# Patient Record
Sex: Male | Born: 1985 | Race: Black or African American | Hispanic: No | Marital: Single
Health system: Southern US, Community
[De-identification: ages and names within clinical notes are randomized; demographics above are authoritative.]

---

## 2011-09-08 ENCOUNTER — Emergency Department: Payer: Self-pay | Admitting: Emergency Medicine

## 2011-09-08 LAB — COMPREHENSIVE METABOLIC PANEL
Albumin: 4 g/dL (ref 3.4–5.0)
Alkaline Phosphatase: 92 U/L (ref 50–136)
Anion Gap: 7 (ref 7–16)
BUN: 13 mg/dL (ref 7–18)
Bilirubin,Total: 0.4 mg/dL (ref 0.2–1.0)
Calcium, Total: 9 mg/dL (ref 8.5–10.1)
Co2: 28 mmol/L (ref 21–32)
EGFR (African American): >60
EGFR (Non-African Amer.): >60
Glucose: 83 mg/dL (ref 65–99)
Potassium: 4.2 mmol/L (ref 3.5–5.1)
SGOT(AST): 48 U/L — ABNORMAL HIGH (ref 15–37)
Sodium: 139 mmol/L (ref 136–145)
Total Protein: 8.3 g/dL — ABNORMAL HIGH (ref 6.4–8.2)

## 2011-09-08 LAB — CBC
HCT: 47.4 % (ref 40.0–52.0)
HGB: 15.9 g/dL (ref 13.0–18.0)
MCH: 31.8 pg (ref 26.0–34.0)
MCV: 95 fL (ref 80–100)
Platelet: 382 10*3/uL (ref 150–440)
RDW: 13.4 % (ref 11.5–14.5)
WBC: 6.7 10*3/uL (ref 3.8–10.6)

## 2011-09-13 ENCOUNTER — Observation Stay: Payer: Self-pay | Admitting: Surgery

## 2011-09-13 LAB — COMPREHENSIVE METABOLIC PANEL
Albumin: 4.2 g/dL (ref 3.4–5.0)
BUN: 13 mg/dL (ref 7–18)
Bilirubin,Total: 0.7 mg/dL (ref 0.2–1.0)
Calcium, Total: 9.5 mg/dL (ref 8.5–10.1)
Chloride: 103 mmol/L (ref 98–107)
EGFR (African American): >60
EGFR (Non-African Amer.): >60
Glucose: 140 mg/dL — ABNORMAL HIGH (ref 65–99)
Osmolality: 276 (ref 275–301)
Potassium: 4.1 mmol/L (ref 3.5–5.1)
SGPT (ALT): 84 U/L — ABNORMAL HIGH
Sodium: 137 mmol/L (ref 136–145)
Total Protein: 9.3 g/dL — ABNORMAL HIGH (ref 6.4–8.2)

## 2011-09-13 LAB — URINALYSIS, COMPLETE
Bacteria: NONE SEEN
Bilirubin,UR: NEGATIVE
Hyaline Cast: 1
Ketone: NEGATIVE
Leukocyte Esterase: NEGATIVE
Protein: 30
Specific Gravity: 1.029 (ref 1.003–1.030)
Squamous Epithelial: NONE SEEN

## 2011-09-13 LAB — CBC WITH DIFFERENTIAL/PLATELET
Basophil %: 0.4 %
Eosinophil #: 0.1 10*3/uL (ref 0.0–0.7)
Eosinophil %: 1.2 %
HCT: 45.7 % (ref 40.0–52.0)
HGB: 15.5 g/dL (ref 13.0–18.0)
Lymphocyte #: 1.8 10*3/uL (ref 1.0–3.6)
Lymphocyte %: 17.7 %
MCH: 31.8 pg (ref 26.0–34.0)
MCHC: 33.9 g/dL (ref 32.0–36.0)
Monocyte #: 0.7 x10 3/mm (ref 0.2–1.0)
Monocyte %: 6.8 %
Neutrophil %: 73.9 %
Platelet: 393 10*3/uL (ref 150–440)
RDW: 13.2 % (ref 11.5–14.5)
WBC: 10.3 10*3/uL (ref 3.8–10.6)

## 2011-09-13 LAB — PROTIME-INR
INR: 0.8
Prothrombin Time: 11.5 secs (ref 11.5–14.7)

## 2011-09-13 LAB — LIPASE, BLOOD: Lipase: 193 U/L (ref 73–393)

## 2011-09-13 LAB — APTT: Activated PTT: <23 secs — ABNORMAL LOW (ref 23.6–35.9)

## 2011-09-14 LAB — CBC WITH DIFFERENTIAL/PLATELET
Basophil #: 0 10*3/uL (ref 0.0–0.1)
HCT: 42.7 % (ref 40.0–52.0)
HGB: 14.1 g/dL (ref 13.0–18.0)
Lymphocyte #: 1.1 10*3/uL (ref 1.0–3.6)
MCH: 31.2 pg (ref 26.0–34.0)
MCHC: 33.1 g/dL (ref 32.0–36.0)
MCV: 94 fL (ref 80–100)
Monocyte #: 1.2 x10 3/mm — ABNORMAL HIGH (ref 0.2–1.0)
Monocyte %: 9.8 %
Neutrophil #: 10 10*3/uL — ABNORMAL HIGH (ref 1.4–6.5)
Platelet: 344 10*3/uL (ref 150–440)
RDW: 13.1 % (ref 11.5–14.5)
WBC: 12.4 10*3/uL — ABNORMAL HIGH (ref 3.8–10.6)

## 2011-09-21 ENCOUNTER — Ambulatory Visit: Payer: Self-pay | Admitting: Surgery

## 2011-09-23 ENCOUNTER — Inpatient Hospital Stay: Payer: Self-pay | Admitting: Surgery

## 2011-09-23 LAB — COMPREHENSIVE METABOLIC PANEL
Anion Gap: 6 — ABNORMAL LOW (ref 7–16)
BUN: 11 mg/dL (ref 7–18)
Bilirubin,Total: 0.4 mg/dL (ref 0.2–1.0)
Calcium, Total: 9.1 mg/dL (ref 8.5–10.1)
Chloride: 105 mmol/L (ref 98–107)
Creatinine: 1.01 mg/dL (ref 0.60–1.30)
EGFR (African American): >60
EGFR (Non-African Amer.): >60
Potassium: 4.1 mmol/L (ref 3.5–5.1)
SGOT(AST): 52 U/L — ABNORMAL HIGH (ref 15–37)
SGPT (ALT): 152 U/L — ABNORMAL HIGH

## 2011-09-23 LAB — LIPASE, BLOOD: Lipase: 170 U/L (ref 73–393)

## 2011-09-24 LAB — CBC WITH DIFFERENTIAL/PLATELET
Basophil #: 0.1 10*3/uL (ref 0.0–0.1)
Basophil %: 0.8 %
HCT: 39.4 % — ABNORMAL LOW (ref 40.0–52.0)
HGB: 13.2 g/dL (ref 13.0–18.0)
Lymphocyte #: 2.7 10*3/uL (ref 1.0–3.6)
Lymphocyte %: 29.9 %
MCH: 31.7 pg (ref 26.0–34.0)
MCV: 94 fL (ref 80–100)
Monocyte %: 10.5 %
Neutrophil #: 4.8 10*3/uL (ref 1.4–6.5)
RDW: 12.8 % (ref 11.5–14.5)
WBC: 9 10*3/uL (ref 3.8–10.6)

## 2013-07-03 IMAGING — US ABDOMEN ULTRASOUND LIMITED
1 series · 14 of 25 positions shown · non-contrast
Comparison: none

REASON FOR EXAM: RUQ pain
COMMENTS:   Body Site: GB and Fossa, CBD, Head of Pancreas

PROCEDURE:     US  - US ABDOMEN LIMITED SURVEY  - September 13, 2011 [DATE]
RESULT:     Comparison: 09/08/2011
TECHNIQUE: Multiple grayscale and color Doppler images were obtained of the
right upper quadrant.

[Series 1: abdomen ultrasound limited · 0.31mm/px · 14 of 32 slices shown]
[im 1/32]
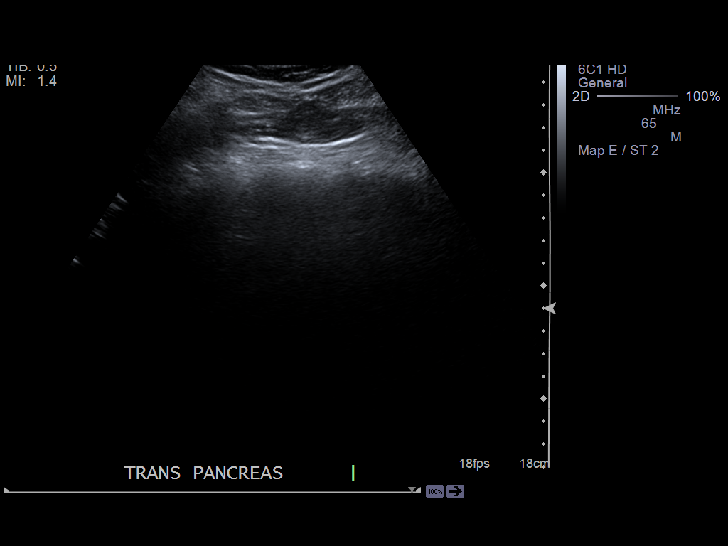
[im 3/32]
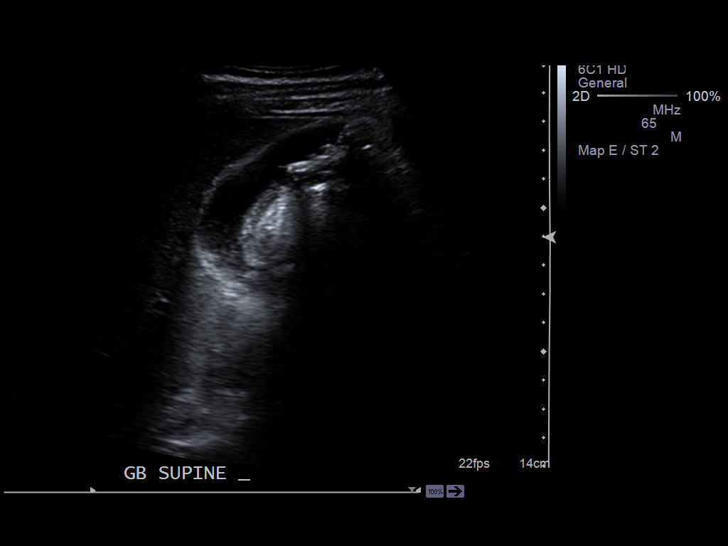
[im 6/32]
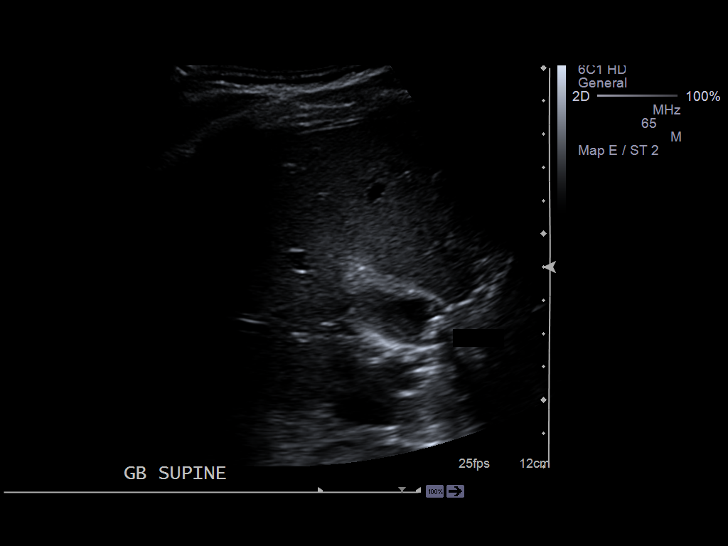
[im 8/32]
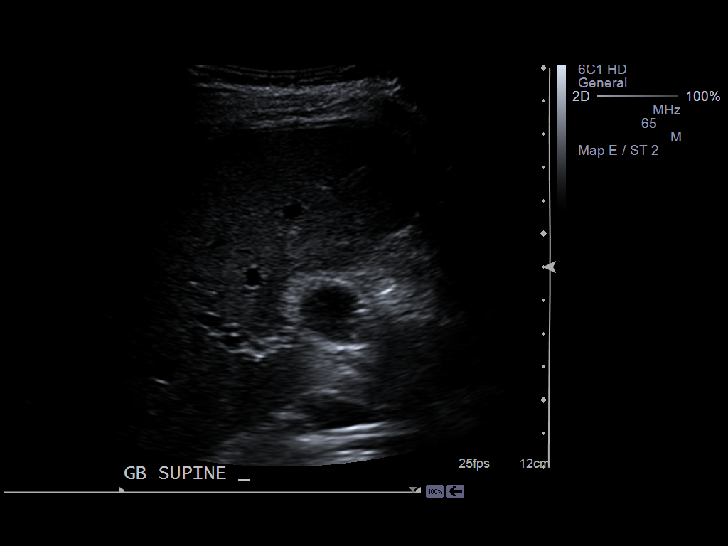
[im 11/32]
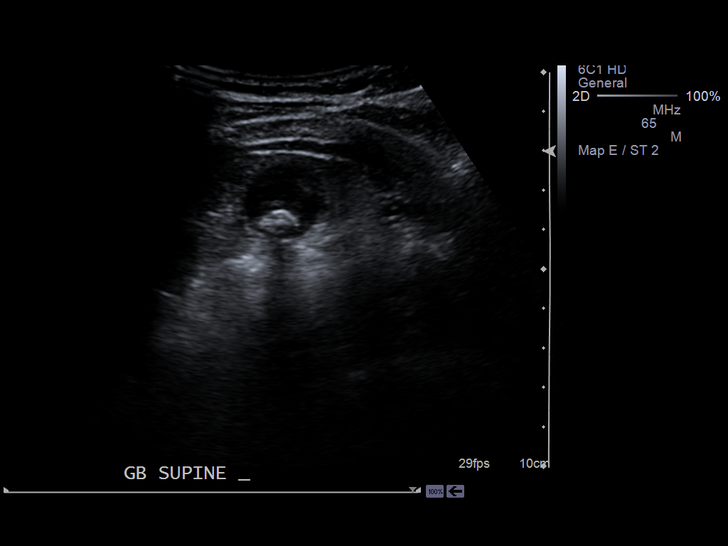
[im 12/32]
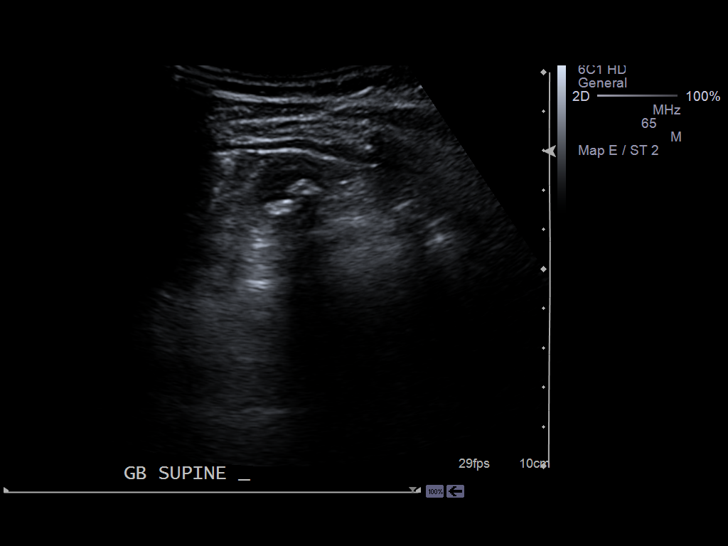
[im 15/32]
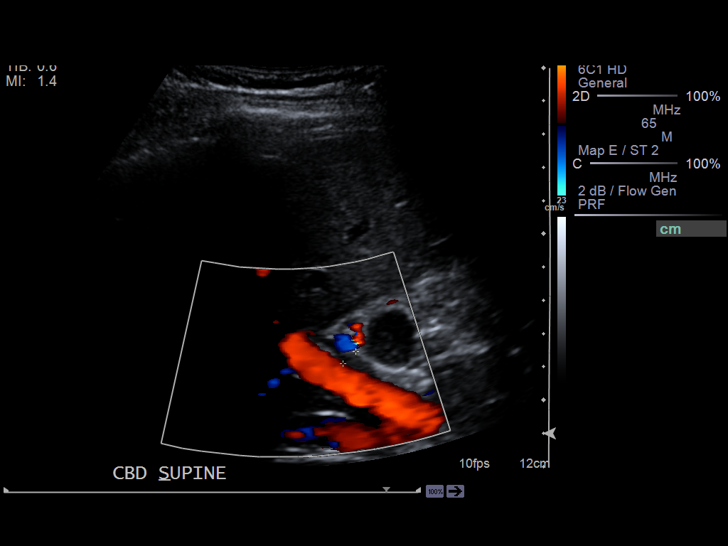
[im 17/32]
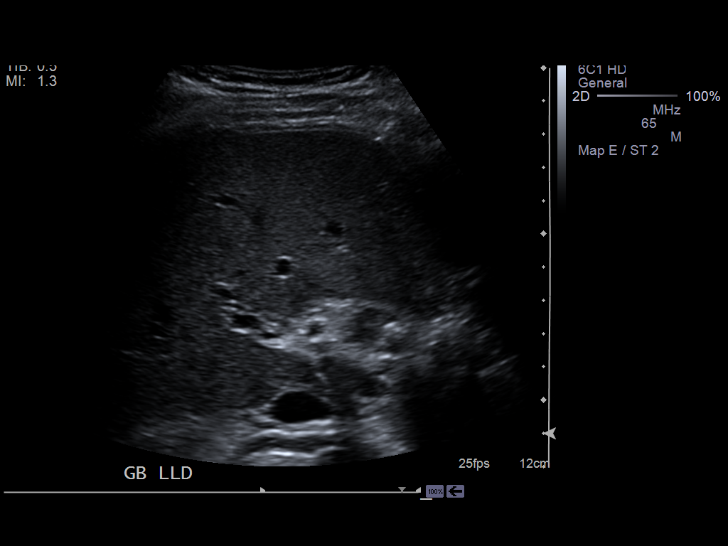
[im 20/32]
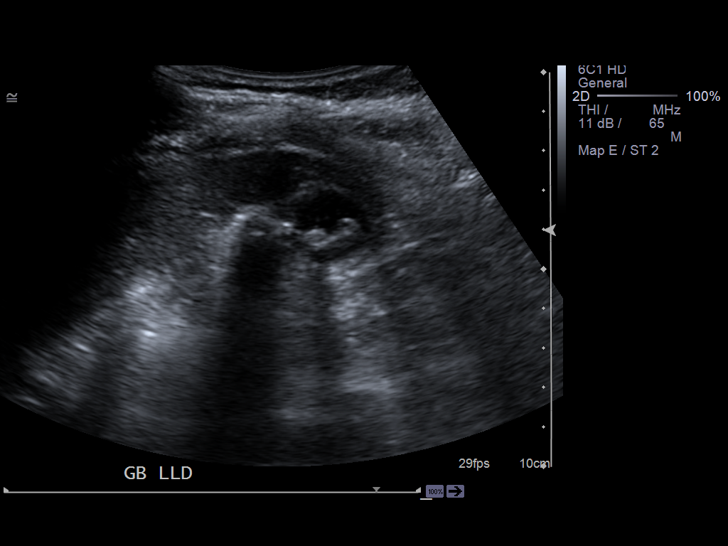
[im 21/32]
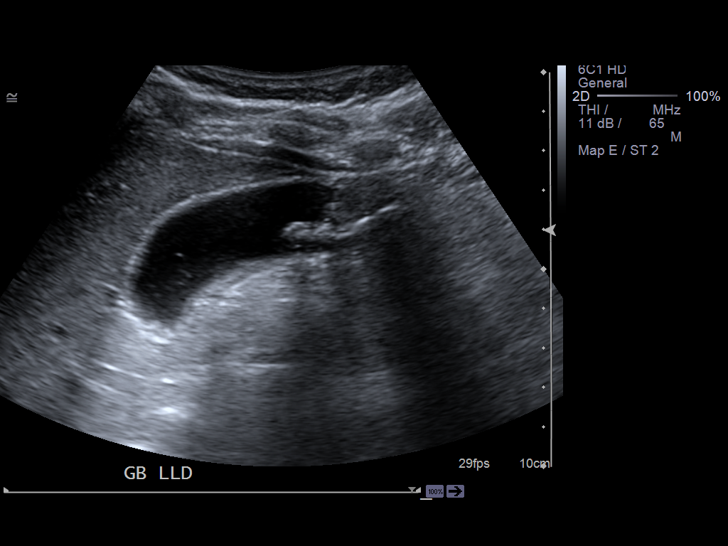
[im 24/32]
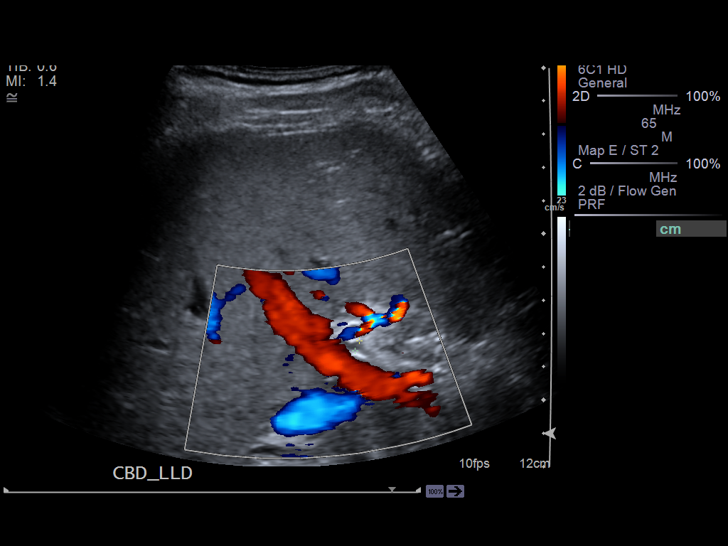
[im 26/32]
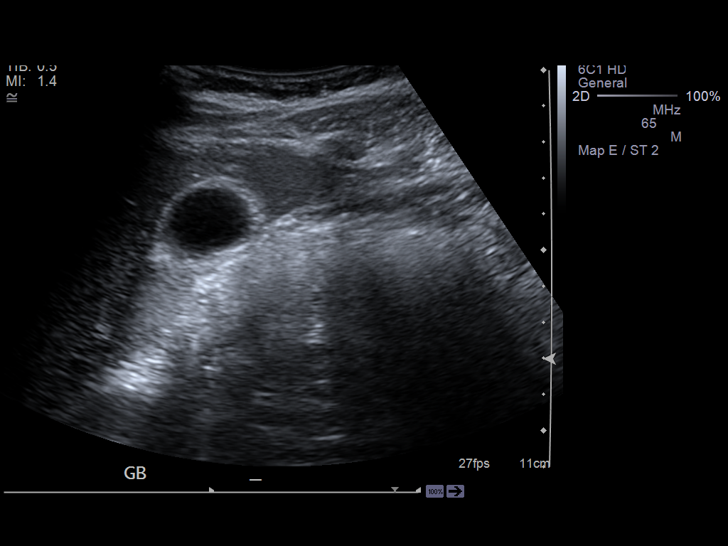
[im 29/32]
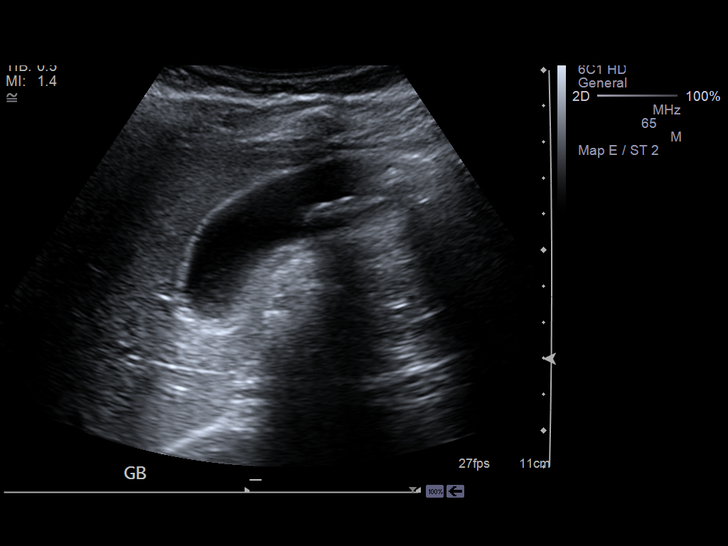
[im 32/32]
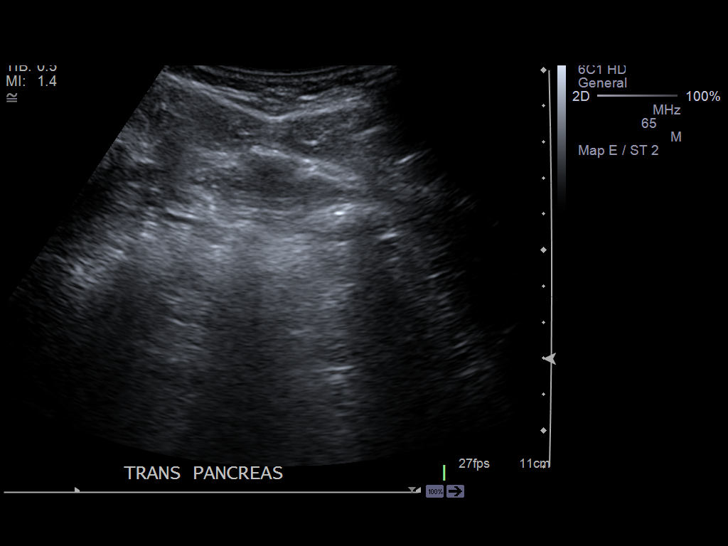

[14 of 25 positions shown; findings below may reference images not displayed]

FINDINGS: The gallbladder is obscured by overlying bowel gas. There are multiple
stones and sludge in the gallbladder. There is a nonmobile stone in the
gallbladder neck. The gallbladder wall thickness is at the upper limits of
normal, measuring 3 mm. The sonographic Murphy sign could not be accurately
assessed secondary to the patient's medicated state. The common bile duct
measures 5 mm in diameter.
IMPRESSION: Cholelithiasis, with nonmobile stone in the gallbladder neck. Gallbladder
wall thickness is at the upper limits of normal. Correlate clinically for
cholecystitis.

[REDACTED]

## 2013-07-11 IMAGING — NM NUCLEAR MEDICINE HEPATOHBILIARY INCLUDE GB
1 series · 9 of 9 positions shown · non-contrast
Comparison: none

REASON FOR EXAM: post op gallbladder bile in JP drain eval bile leak
COMMENTS:

[Series 1000: billiary leak · 4.80mm/px · 9 of 9 slices shown]
[im 1/9]
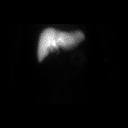
[im 2/9]
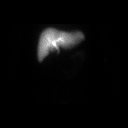
[im 3/9]
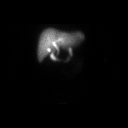
[im 4/9]
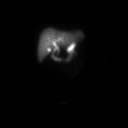
[im 5/9]
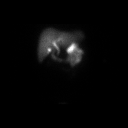
[im 6/9]
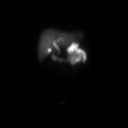
[im 7/9]
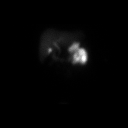
[im 8/9]
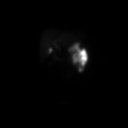
[im 9/9]
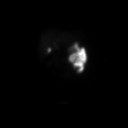

[9 of 9 positions shown; findings below may reference images not displayed]

PROCEDURE:     NM  - NM HEPATOBILIARY IMAGE  - September 21, 2011 [DATE]

RESULT:     The patient is undergoing evaluation for possible bile leak. The
patient is status post cholecystectomy and has a Jackson-Pratt drain place.
The patient received 8.3 mCi of technetium 99m labeled Choletec. Patient's
drainage tube was clamped prior to scanning.

There is adequate uptake of the radiopharmaceutical by the liver. Activity
is demonstrated within the intrahepatic ducts and common bile duct by 5
minutes and duodenal activity is demonstrated by 10 to 15 minutes. On the 10
minute film there is a focus of activity demonstrated in the porta hepatis
that persists and becomes gradually larger that is worrisome for small
contained bile leak.
IMPRESSION: The findings are worrisome for a bile leak in the porta
hepatis to the right of the common bile duct. CT scanning is recommended to
assess this region more completely.

## 2013-07-13 IMAGING — US ABDOMEN ULTRASOUND
1 series · 14 of 25 positions shown · non-contrast
Comparison: none

REASON FOR EXAM: COMMENTS:

[Series 1: abdomen ultrasound · 0.25mm/px · 14 of 93 slices shown]
[im 1/93]
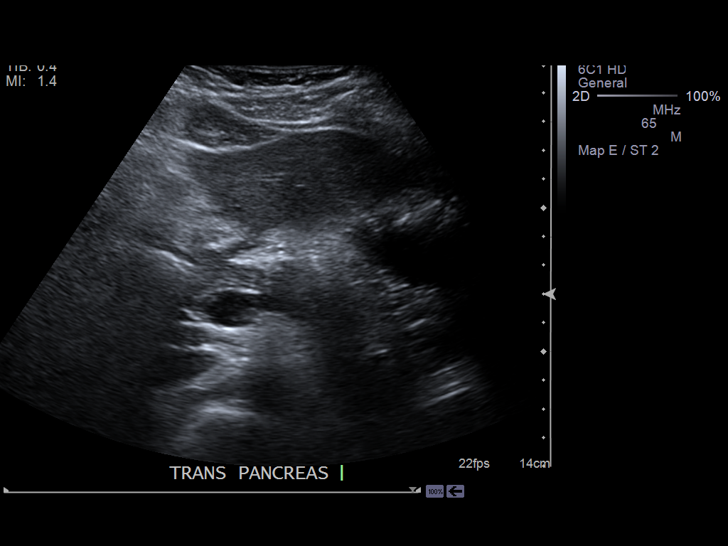
[im 8/93]
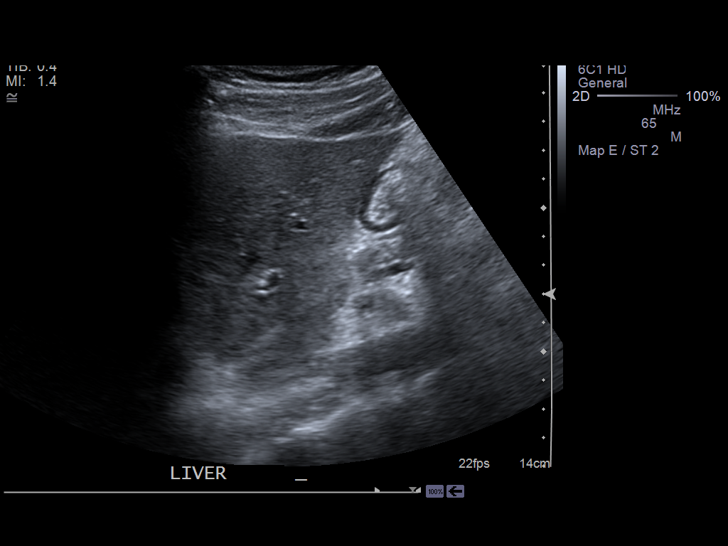
[im 16/93]
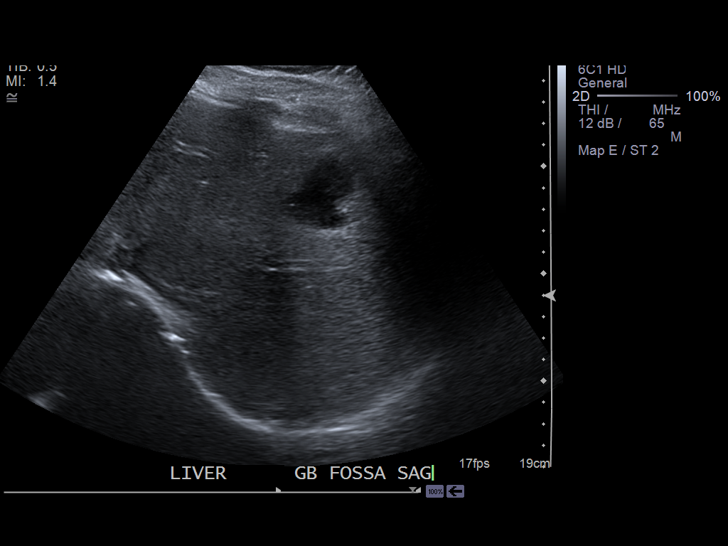
[im 24/93]
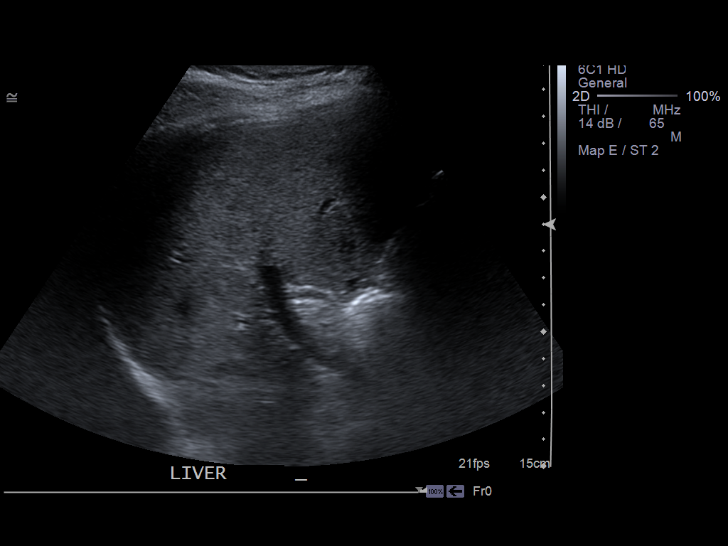
[im 31/93]
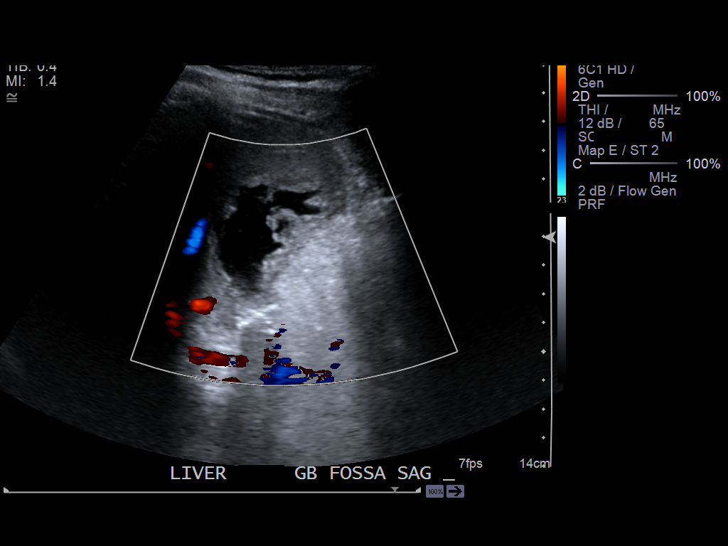
[im 35/93]
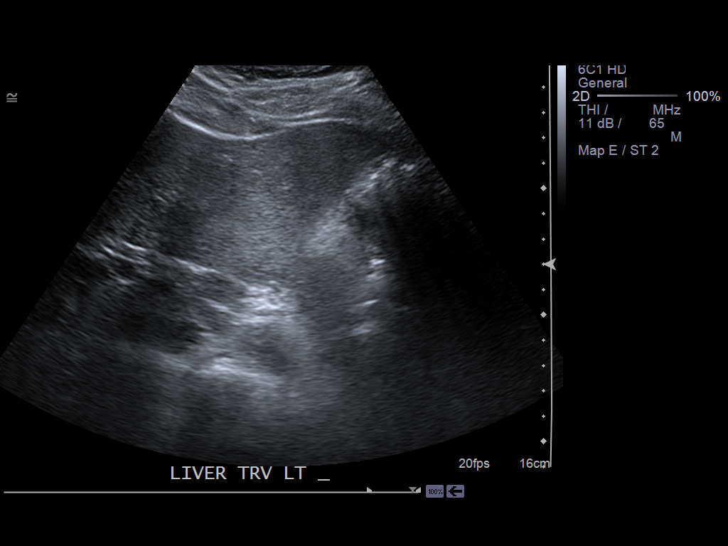
[im 43/93]
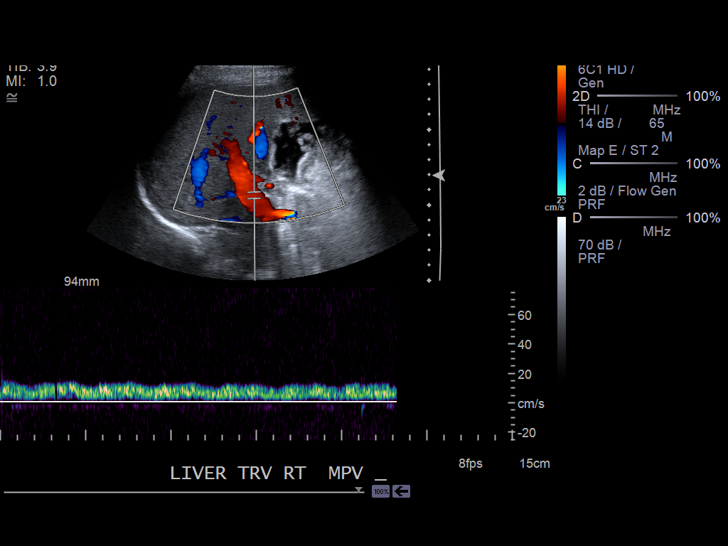
[im 50/93]
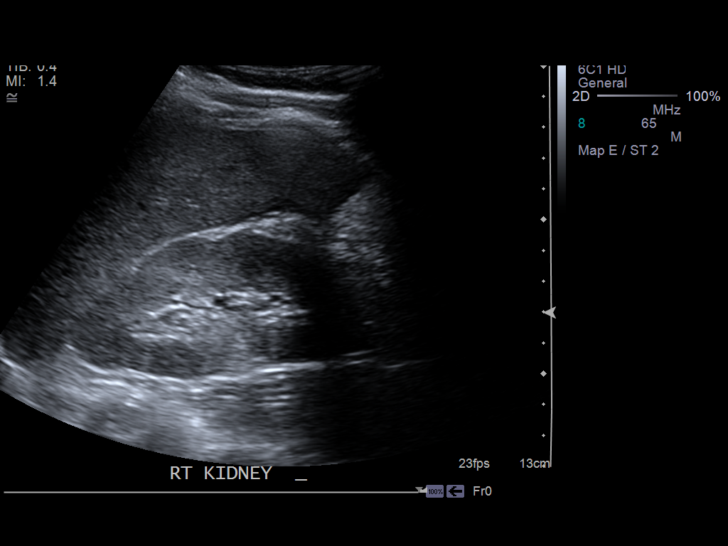
[im 58/93]
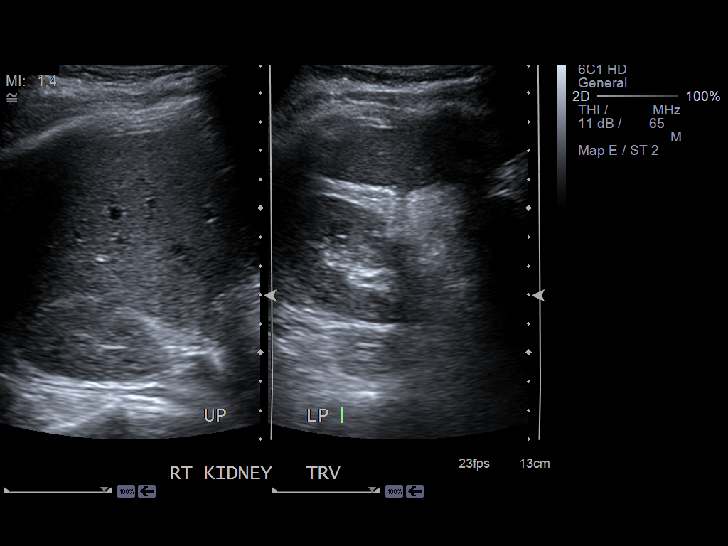
[im 62/93]
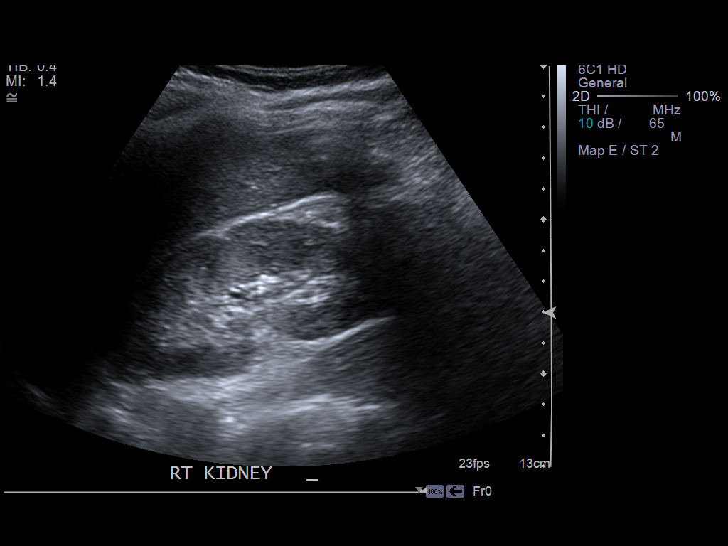
[im 70/93]
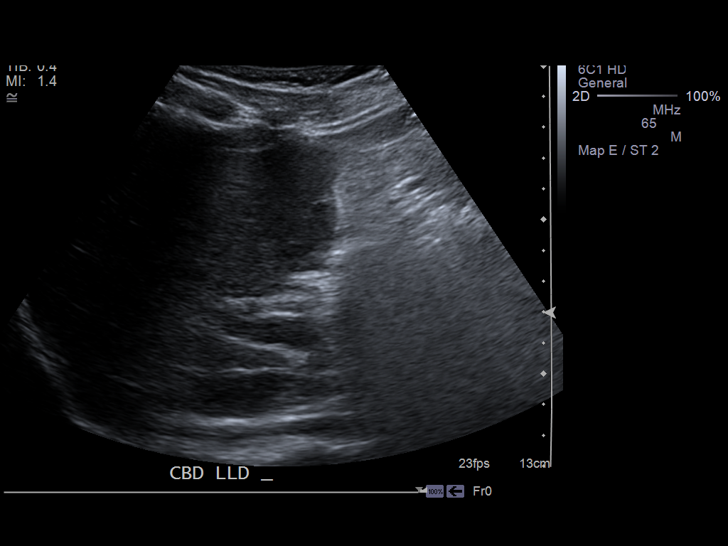
[im 77/93]
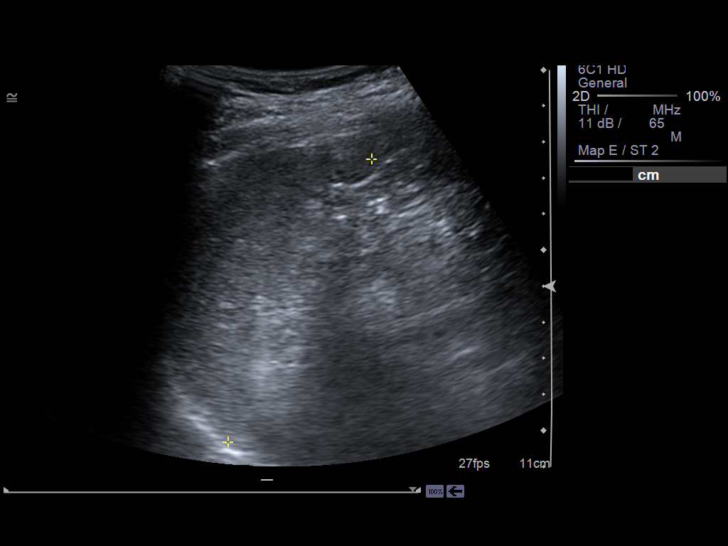
[im 85/93]
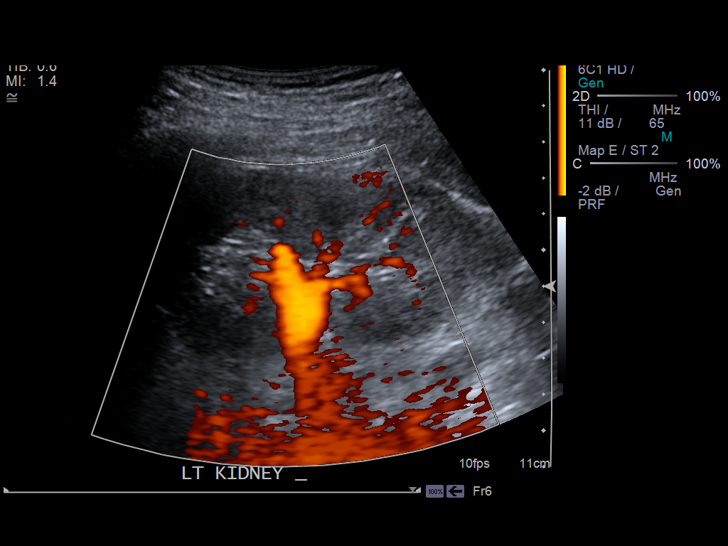
[im 93/93]
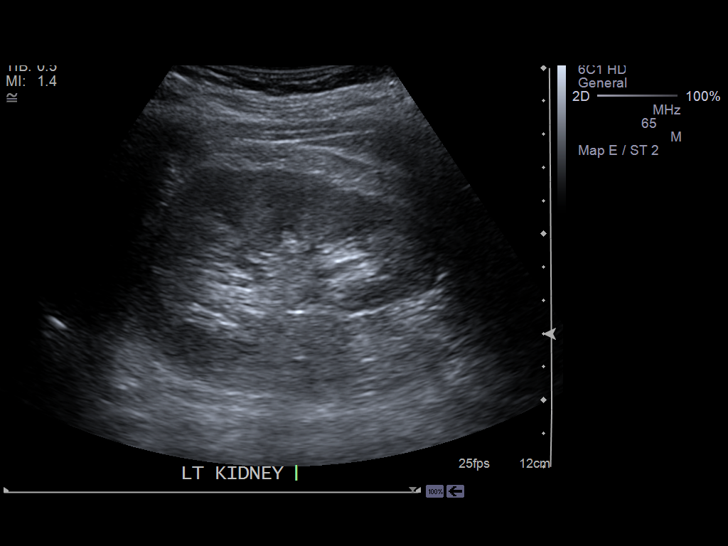

[14 of 25 positions shown; findings below may reference images not displayed]

PROCEDURE:     US  - US ABDOMEN GENERAL SURVEY  - September 23, 2011  [DATE]

RESULT:     [REDACTED]

Addendum:

The previous report was dictated based on images of 09/08/2011.

The CORRECT report is below based on images of 09/23/2011

There is a small complex fluid collection in the gallbladder fossa which may
resent a biloma versus hematoma versus seroma measuring approximately 4.5 x
2.3 cm. There is no internal Doppler flow.

The common bile duct is normal in caliber measuring 4.6 mm. There is no
choledocholithiasis. There is no perihepatic free fluid.

The liver, spleen, pancreas, right kidney and left kidney demonstrate no
focal abnormality. The abdominal aorta and IVC are unremarkable.
IMPRESSION: There is a small complex fluid collection in the gallbladder fossa which may
resent a biloma versus hematoma versus seroma measuring approximately 4.5 x
2.3 cm.

## 2014-07-03 NOTE — Discharge Summary (Signed)
PATIENT NAME:  Victor MaltaROBINSON, Stanford MR#:  [id number] DATE OF BIRTH:  [birth year]  DATE OF ADMISSION:  09/23/2011 DATE OF DISCHARGE:  09/25/2011  BRIEF HISTORY: Victor MaltaJason Meineke is a 29 year old gentleman who underwent laparoscopic cholecystectomy for symptomatic biliary tract disease on 09/13/2011. His admission was uncomplicated and he was sent home with a JP drain in place on 07/01. Did have significant inflammatory change at the time of surgery and was discharged with the plan of following up in the office in several days. He was seen in the office on 07/05 noted to have bile drainage in his JP drain. For that reason he was recommended to be hospitalized for further follow up of possible bile leak. He refused hospitalization at that time agreeing to return to the hospital on Monday for HIDA scan. The HIDA scan was performed on 07/08 which did demonstrate evidence of possible bile leak. We again recommend he be hospitalized for further investigation but he declined stating he did not come to the hospital until the 10th. [facility] was [facility]10 for further evaluation. Still had bile in his drain but had no significant elevation of his liver function studies, no elevation of his white blood cell count and no abdominal symptoms. He was seen by the GI service. Lutricia Feilaul Oh consulted on the case and agreed with the plan to pursue ERCP. The procedure was performed the following morning without difficulty. No evidence of bile leak was encountered. His JP drain was removed on the afternoon of the 11th and he was discharged home on the morning and 12th having tolerated a regular diet with no significant problems. He was ambulating without difficulty. He is discharged home on Norco 5/325, 1 to 2 tablets p.o. q.6 hours p.r.n.   FINAL DISCHARGE DIAGNOSIS: Postoperative bile leak.   SURGERY: ERCP.   ____________________________ Carmie Endalph L. Ely III, MD rle:cms D: 10/02/2011 20:45:45 ET T: 10/05/2011 10:03:55  ET JOB#: [id number]  cc: Carmie Endalph L. Ely III, MD, <Dictator> Ezzard StandingPaul Y. Bluford Kaufmannh, MD Quentin OreALPH L ELY MD ELECTRONICALLY SIGNED 10/06/2011 22:42

## 2014-07-08 NOTE — H&P (Signed)
Subjective/Chief Complaint severe abdominal pain    History of Present Illness 26 yobm with acute onset of severe epigastric and RUQ abdominal pain around 4 am.  Unrelenting, no fever, no jaundice, no sick contacts, previous episode of pain earlier this week which resolved following visit to ER.    Past History none    Primary Physician none   Past Med/Surgical Hx:  Denies medical history:   Denies surgical history.:   ALLERGIES:  No Known Allergies:   Family and Social History:   Family History gallstones in mother    Social History positive  tobacco, positive tobacco (Greater than 1 year), positive ETOH, negative Illicit drugs    Place of Living Home  employed.   Review of Systems:   Subjective/Chief Complaint see above    Abdominal Pain Yes    Nausea/Vomiting No    Tolerating Diet No    ROS ros as above.    Medications/Allergies Reviewed Medications/Allergies reviewed   Physical Exam:   GEN no acute distress, thin    HEENT red conjunctivae    NECK supple    RESP normal resp effort    CARD regular rate    ABD positive tenderness  denies Flank Tenderness  no liver/spleen enlargement  no hernia  distended    LYMPH negative neck    EXTR negative cyanosis/clubbing    SKIN normal to palpation    NEURO cranial nerves intact    PSYCH A+O to time, place, person   Lab Results: Hepatic:  30-Jun-13 07:56    Bilirubin, Total 0.7   Alkaline Phosphatase 98   SGPT (ALT)  84 (12-78 NOTE: NEW REFERENCE RANGE 02/06/2011)   SGOT (AST) 35   Total Protein, Serum  9.3   Albumin, Serum 4.2  Routine Chem:  30-Jun-13 07:56    Glucose, Serum  140   BUN 13   Creatinine (comp) 1.14   Sodium, Serum 137   Potassium, Serum 4.1   Chloride, Serum 103   CO2, Serum 26   Calcium (Total), Serum 9.5   Osmolality (calc) 276   eGFR (African American) >60   eGFR (Non-African American) >60 (eGFR values <51m/min/1.73 m2 may be an indication of chronic kidney disease  (CKD). Calculated eGFR is useful in patients with stable renal function. The eGFR calculation will not be reliable in acutely ill patients when serum creatinine is changing rapidly. It is not useful in  patients on dialysis. The eGFR calculation may not be applicable to patients at the low and high extremes of body sizes, pregnant women, and vegetarians.)   Anion Gap 8   Lipase 193 (Result(s) reported on 13 Sep 2011 at 09:07AM.)  Routine UA:  30-Jun-13 07:58    Color (UA) Yellow   Clarity (UA) Hazy   Glucose (UA) Negative   Bilirubin (UA) Negative   Ketones (UA) Negative   Specific Gravity (UA) 1.029   Blood (UA) Negative   pH (UA) 6.0   Protein (UA) 30 mg/dL   Nitrite (UA) Negative   Leukocyte Esterase (UA) Negative (Result(s) reported on 13 Sep 2011 at 09:07AM.)   RBC (UA) 3 /HPF   WBC (UA) 1 /HPF   Bacteria (UA) NONE SEEN   Epithelial Cells (UA) NONE SEEN   Mucous (UA) PRESENT   Hyaline Cast (UA) 1 /LPF (Result(s) reported on 13 Sep 2011 at 09:07AM.)  Routine Hem:  30-Jun-13 07:56    WBC (CBC) 10.3   RBC (CBC) 4.86   Hemoglobin (CBC) 15.5  Hematocrit (CBC) 45.7   Platelet Count (CBC) 393   MCV 94   MCH 31.8   MCHC 33.9   RDW 13.2   Neutrophil % 73.9   Lymphocyte % 17.7   Monocyte % 6.8   Eosinophil % 1.2   Basophil % 0.4   Neutrophil #  7.6   Lymphocyte # 1.8   Monocyte # 0.7   Eosinophil # 0.1   Basophil # 0.0 (Result(s) reported on 13 Sep 2011 at 09:16AM.)   Radiology Results: [place]:    25-Jun-13 06:48, US Abdomen Limited Survey   US Abdomen Limited Survey   REASON FOR EXAM:    epigastric and ruq pain  COMMENTS:   Body Site: GB and Fossa, CBD, Head of Pancreas; Right Upper   Quad    PROCEDURE: [place]  - US ABDOMEN LIMITED SURVEY  - Sep 08 2011  6:48AM     RESULT: Comparison: None.    Technique: Multiple grayscale and color Doppler images were obtained of   the right upper quadrant.    Findings:  The pancreas was mostly obscured by overlying bowel gas.  The visualized   portion of the body of the pancreas is unremarkable. There are multiple   stones in thegallbladder. No gallbladder wall thickening or     pericholecystic fluid. The sonographic Percell Miller sign was negative. The   common bile duct measures 4 mm in diameter. The visualized liver is   unremarkable. The portal vein is patent.    IMPRESSION:   Cholelithiasis, without sonographic evidence of acute cholecystitis.          Verified By: Gregor Hams, M.D., MD    30-Jun-13 11:53, US Abdomen Limited Survey   US Abdomen Limited Survey   REASON FOR EXAM:    RUQ pain  COMMENTS:   Body Site: GB and Fossa, CBD, Head of Pancreas    PROCEDURE: [place]  - US ABDOMEN LIMITED SURVEY  - Sep 13 2011 11:53AM     RESULT: Comparison: 09/08/2011    Technique: Multiple grayscale and color Doppler images were obtained of   the right upper quadrant.    Findings:  The gallbladder is obscured by overlying bowel gas. There are multiple   stones and sludge in the gallbladder. There is a nonmobile stone in the   gallbladder neck. The gallbladder wall thickness is at the upper limits   of normal, measuring 3 mm. The sonographic Percell Miller sign could not be     accurately assessed secondary to the patient's medicated state. The   common bile duct measures 5 mm in diameter.    IMPRESSION:   Cholelithiasis, with nonmobile stone in the gallbladder neck. Gallbladder   wall thickness is at the upper limits of normal. Correlate clinically for   cholecystitis.      Dictation Site: 8          Verified By: Gregor Hams, M.D., MD     Assessment/Admission Diagnosis 26 yobm with acute calculus cholecystitis    Plan admit, lap chole later today. invanz, scd's void on call to surgery discussed with patient and father r/b/a to cholecystectomy including bile duct injury (0.05% risk), open procedure and need for ercp post op.  total time spent 45 minutes  Both [place] personally reviewed.   Electronic  Signatures: Sherri Rad (MD)  (Signed 30-Jun-13 14:48)  Authored: CHIEF COMPLAINT and HISTORY, PAST MEDICAL/SURGIAL HISTORY, ALLERGIES, FAMILY AND SOCIAL HISTORY, REVIEW OF SYSTEMS, PHYSICAL EXAM, LABS, Radiology, ASSESSMENT AND PLAN  Last Updated: 30-Jun-13 14:48 by Sherri Rad (MD)

## 2014-07-08 NOTE — Consult Note (Signed)
Normal ERCP. NO bile leak seen anywhere. Biliary sphincterotomy done and balloon sweeps. Full liquid diet. JP drain can be removed today. Pt can be discharged if stable. Will sign off. Thanks.  Electronic Signatures: Lutricia Feilh, Spenser Cong (MD)  (Signed on 11-Jul-13 14:20)  Authored  Last Updated: 11-Jul-13 14:20 by Lutricia Feilh, Madia Carvell (MD)

## 2014-07-08 NOTE — Op Note (Signed)
PATIENT NAME:  Victor Anderson, Victor Anderson MR#:  [id number] DATE OF BIRTH:  1985/07/12  DATE OF PROCEDURE:  09/13/2011  PREOPERATIVE DIAGNOSIS: Acute calculus cholecystitis.  POSTOPERATIVE DIAGNOSIS: Acute calculus cholecystitis, with hydrops.  PROCEDURE PERFORMED: Laparoscopic cholecystectomy.   SURGEON: Natale LayMark Alaylah Heatherington, MD  ASSISTANT: Surgical Scrub Technologist.  ANESTHESIA: General endotracheal - [facility]  FINDINGS: Acute calculus cholecystitis with hydrops.  SPECIMENS: Gallbladder with contents.  DRAINS: Jackson-Pratt x1 in gallbladder fossa.  ESTIMATED BLOOD LOSS: 50 mL.  DESCRIPTION OF PROCEDURE:  With the patient in the supine position general oral endotracheal anesthesia was induced. His abdomen was clipped or hair, prepped and draped with ChloraPrep solution. The left arm was padded and tucked at his side. Time out was observed. A 12 mm bladeless trocar was placed through an open technique through stay sutures and an infraumbilical transversely oriented skin incision. The remaining trocars were placed in the standard fashion.  The gallbladder was acutely distended. It was edematous. It was grasped in the usual fashion and elevated towards the right lobe of the liver. Dissection within the hepatoduodenal ligament demonstrated a cystic duct and cystic artery with one posterior branch.  Critical view of cholecystectomy was performed with the cystic duct gallbladder junction being critically identified.  Three clips were placed on the portal side of the cystic duct, one on the gallbladder side, and the structure was then divided. The cystic artery was divided between two hemoclips on the main trunk, one hemoclip on either branch, with sharp scissors. The gallbladder was then retrieved out of the gallbladder fossa utilizing hook cautery apparatus and counter traction.  The liver was very friable and part of the gallbladder was entered hepatic and in doing this dissection multiple  tears within the hepatic capsule were encountered with hemostasis being obtained with point cautery and application of Surgiflo with thrombin application.  Furthermore, near the hepatic falciform ligament, a small rent in the capsule of the liver was created with the instrument from the epigastric port.  This did not bleed and a small amount of Surgicel with thrombin application was applied to it.  With the drain inserted into the gallbladder fossa and exiting the right lower quadrant port site and securing the drain site with a nylon suture, the ports were then removed under direct visualization with hemostasis being adequate. The gallbladder was then retrieved prior to closure with an Endo Catch device. The infraumbilical fascial defect was closed with multiple simple and figure of eight #0 Vicryl sutures and the existing stay sutures placed at the initial point of the operation were tied to each other.  A total of 30 mL of 0.25% plain Marcaine was infiltrated along all skin and fascial incisions prior to closure.  Skin edges were reapproximated utilizing 4-0 subcuticular.  The patient was then subsequently extubated after sterile dressings were placed and taken to the recovery room in stable and satisfactory condition by anesthesia services.  ____________________________ [name], MD mab:slb D: 09/13/2011 17:37:35 ET T: 09/14/2011 08:53:53 ET JOB#: [id number]  cc: Loraine LericheMark A. Egbert GaribaldiBird, MD, <Dictator> Raynald KempMARK A Daved Mcfann MD ELECTRONICALLY SIGNED 09/15/2011 9:48

## 2014-07-08 NOTE — Consult Note (Signed)
Brief Consult Note: Diagnosis: Admitted for bile leak. s/p cholecystectomy.   Consult note dictated.   Orders entered.   Discussed with Attending MD.   Comments: Patient's presentation discussed with Dr. Lutricia FeilPaul Oh.  Recommendation is to proceed with ERCP tomorrow for the indication of bile leak.  Order placed.  NPO after midnight.  Continue with antibiotic therapy.  Will continue to monitor.  Electronic Signatures: Rodman KeyHarrison, Payden Bonus S (NP)  (Signed 10-Jul-13 16:32)  Authored: Brief Consult Note   Last Updated: 10-Jul-13 16:32 by Rodman KeyHarrison, Danuta Huseman S (NP)

## 2014-07-08 NOTE — Consult Note (Signed)
Pt seen and examined. See Dawn Harrison's notes. S/P choly. JP drain in. HIDA from 7/8 suggest bile leak. Doubt repeating HIDA now will change much. Discussed scheduling ERCP tomorrow to assess for bile leak. If positive, then biliary sphincterotomy with biliary stenting will be performed. Drain can be pulled afterwards. Discussed procedure. Discussed potential risks, incl pancreatitis. Thanks.  Electronic Signatures: Lutricia Feilh, Edith Groleau (MD)  (Signed on 11-Jul-13 08:20)  Authored  Last Updated: 11-Jul-13 08:20 by Lutricia Feilh, Joell Buerger (MD)

## 2014-07-08 NOTE — Consult Note (Signed)
PATIENT NAME:  Victor Anderson, Victor Anderson MR#:  [id number] DATE OF BIRTH:  20-Nov-1985  DATE OF CONSULTATION:  09/23/2011  REFERRING PHYSICIAN:  Tiney Rouge, MD CONSULTING PHYSICIAN:  Lutricia Feil, MD / Rodman Key, NP  HISTORY OF PRESENT ILLNESS: [name] is a 29 year old African American gentleman who underwent cholecystectomy by Dr. Egbert Garibaldi on 09/13/2011. He was seen and followed by Dr. Marshia Ly this past Friday for drain removal. At that time, concern existed for possible bile leak. The patient had a HIDA scan that was done on 09/21/2011 which revealed the possibility of a bile leak. There were findings worrisome for bile leak in the porta hepatis to the right of the common bile duct. It was recommended for the patient to be admitted at that time. The patient declined. It was agreed to be admitted today. He has been asymptomatic as far as no abdominal pain, no nausea and no vomiting. He is able to tolerate a regular diet. Last bowel movement was yesterday evening. No evidence of bright red blood or melena. Afebrile. The patient states that he feels very well after having his surgery done. Laboratory studies from today. Hepatic panel revealed an ALT elevated at 152, AST 52, and albumin 3.2 with an anion gap of 6, otherwise within normal limits. Lipase was 170.   PAST MEDICAL HISTORY: Unremarkable.   PAST SURGICAL HISTORY: Laparoscopic cholecystectomy on 09/13/2011.  FAMILY HISTORY: Mother deceased with sarcoidosis. No colon cancer or neoplasm.   SOCIAL HISTORY: Smokes 1/2 pack of cigarettes a day. Alcohol - two alcoholic drinks a week. Employed with [employer]. No recreational drug use.   REVIEW OF SYSTEMS: CONSTITUTIONAL: No fevers and chills or weight loss. HEENT: No epistaxis. No blurred vision or double vision. RESPIRATORY: No coughing, no wheezing, or hemoptysis. CARDIOVASCULAR: No chest pain. No orthopnea or edema. GASTROINTESTINAL: See history of present illness. GENITOURINARY: No dysuria or  hematuria. SKIN: No rashes, jaundice. ENDOCRINE: No polyuria, polydipsia. HEME/LYMPH: No anemia. Denies significant easy bruising or bleeding. MUSCULOSKELETAL: No neuralgias or myalgias. NEUROLOGICAL: No gross neurological concern such as CVA, transient ischemic attack, or seizure disorder. PSYCH: No depression. No anxiety.    PHYSICAL EXAMINATION:   VITAL SIGNS: Temperature 98.4 with a pulse of 86, respirations 18, and blood pressure 142/82 with a pulse oximetry of 97% on room air.   GENERAL: Well developed, well nourished 29 year old African American gentleman in no acute distress noted, pleasant, resting comfortably in bed.   HEENT: Normocephalic, atraumatic. Pupils equal and reactive to light. Conjunctivae clear. Sclerae anicteric.   NECK: Supple. Trachea midline. No lymphadenopathy or thyromegaly.   PULMONARY: Symmetric rise and fall of chest. Clear to auscultation throughout.   CARDIOVASCULAR: Regular rhythm S1 and S2. No murmurs or gallops.   ABDOMEN: Soft, nondistended. Bowel sounds in four quadrants. Dressing present to right upper quadrant with drain in place. Scant amount of bile. Drainage with scant amount of flecks. Bright red blood noted in the tubing. No masses.  No bruits.  RECTAL: Deferred.   MUSCULOSKELETAL: Movement of all four extremities. No contractures. No clubbing.   EXTREMITIES: No edema.   PSYCH: Alert and oriented x4. Memory grossly intact. Appropriate affect and mood.   NEUROLOGICAL: No gross neurological deficits.   RADIOLOGIC DATA: Abdominal x-ray done on today's date revealed evidence of cholelithiasis. No intrahepatic or extrahepatic biliary ductal dilatation. Common bile duct measures 4.2 mm in maximum diameter. No gallbladder wall thickening. No pericholecystic fluid or sonographic Murphy's sign. Otherwise unremarkable. Concern existing though given findings that  the patient is status post cholecystectomy on 09/13/2011.   IMPRESSION: Admitted for bile  leak noted in association mild elevation of LFTs status post cholecystectomy on 09/13/2011.   PLAN: The patient's presentation was discussed with Dr. Lutricia FeilPaul Oh as well as discussed with Dr. Marshia Lyandy Ely. Recommendation is to proceed forward with an ERCP tomorrow for the indication of bile leak. Schedule with Dr. Lutricia FeilPaul Oh. The patient able to resume caloric intake this evening but n.p.o. after midnight. Orders placed. Continuation of antibiotic therapy. We will continue to monitor.   These services provided by Rodman Keyawn S. Monika Chestang, MS, APRN, [facility], FNP under collaborative agreement with Dr. Lutricia FeilPaul Oh. ____________________________ Rodman Keyawn S. Masoud Nyce, NP dsh:slb D: 09/23/2011 16:39:01 ET T: 09/23/2011 16:50:52 ET JOB#: [id number]  cc: Rodman Keyawn S. Flois Mctague, NP, <Dictator> Rodman KeyAWN S Ariq Khamis MD ELECTRONICALLY SIGNED 09/24/2011 18:28
